# Patient Record
Sex: Female | Born: 1980 | Race: White | Hispanic: No | Marital: Married | Smoking: Former smoker
Health system: Southern US, Community
[De-identification: ages and names within clinical notes are randomized; demographics above are authoritative.]

## PROBLEM LIST (undated history)

## (undated) DIAGNOSIS — M069 Rheumatoid arthritis, unspecified: Secondary | ICD-10-CM

## (undated) DIAGNOSIS — R918 Other nonspecific abnormal finding of lung field: Secondary | ICD-10-CM

## (undated) HISTORY — DX: Rheumatoid arthritis, unspecified: M06.9

## (undated) HISTORY — DX: Other nonspecific abnormal finding of lung field: R91.8

---

## 2008-01-16 HISTORY — PX: WISDOM TOOTH EXTRACTION: SHX21

## 2014-10-26 ENCOUNTER — Ambulatory Visit (INDEPENDENT_AMBULATORY_CARE_PROVIDER_SITE_OTHER): Payer: BLUE CROSS/BLUE SHIELD | Admitting: Physician Assistant

## 2014-10-26 VITALS — BP 118/86 | HR 76 | Temp 98.4°F | Resp 16 | Ht 64.0 in | Wt 135.0 lb

## 2014-10-26 DIAGNOSIS — J029 Acute pharyngitis, unspecified: Secondary | ICD-10-CM | POA: Diagnosis not present

## 2014-10-26 LAB — POCT RAPID STREP A (OFFICE): RAPID STREP A SCREEN: NEGATIVE

## 2014-10-26 NOTE — Progress Notes (Signed)
   Amy Choi  MRN: [id number] DOB: 01-16-1981  Subjective:  Pt presents to clinic with sore throat.  She has had problems with strep in the past. She started with a sore throat yesterday and she wants to make sure it is not strep throat because she has plans with other children later this week.  She has some mild additional cold symptoms.    Home treatment- herbal remedies that seem to be helping as well as a netti pot  There are no active problems to display for this patient.   No current outpatient prescriptions on file prior to visit.   No current facility-administered medications on file prior to visit.    Not on File  Review of Systems  Constitutional: Negative for fever and chills.  HENT: Positive for congestion (intermittnet - now resolved), postnasal drip, rhinorrhea (clear) and sore throat.   Respiratory: Negative for cough.   Gastrointestinal: Negative.   Musculoskeletal: Negative for myalgias.  Allergic/Immunologic: Positive for environmental allergies.  Neurological: Positive for light-headedness. Negative for headaches.   Objective:  BP 118/86 mmHg  Pulse 76  Temp(Src) 98.4 F (36.9 C) (Oral)  Resp 16  Ht 5\' 4"  (1.626 m)  Wt 135 lb (61.236 kg)  BMI 23.16 kg/m2  SpO2 98%  LMP 10/17/2014  Physical Exam  Constitutional: She is oriented to person, place, and time and well-developed, well-nourished, and in no distress.  HENT:  Head: Normocephalic and atraumatic.  Right Ear: Hearing, tympanic membrane, external ear and ear canal normal.  Left Ear: Hearing, tympanic membrane, external ear and ear canal normal.  Nose: Nose normal.  Mouth/Throat: Uvula is midline, oropharynx is clear and moist and mucous membranes are normal. Uvula swelling present.  Gentican violet stain in her mouth  Eyes: Conjunctivae are normal.  Neck: Normal range of motion.  Cardiovascular: Normal rate, regular rhythm and normal heart sounds.   No murmur heard. Pulmonary/Chest: Effort  normal and breath sounds normal.  Neurological: She is alert and oriented to person, place, and time. Gait normal.  Skin: Skin is warm and dry.  Psychiatric: Mood, memory, affect and judgment normal.  Vitals reviewed.  Results for orders placed or performed in visit on 10/26/14  POCT rapid strep A  Result Value Ref Range   Rapid Strep A Screen Negative Negative    Assessment and Plan :  Sore throat - Plan: POCT rapid strep A, Culture, Group A Strep   Continue symptomatic treatment  12/26/14 PA-C  [facility] 10/26/2014 10:12 AM

## 2014-10-27 LAB — CULTURE, GROUP A STREP: ORGANISM ID, BACTERIA: NORMAL

## 2014-10-31 ENCOUNTER — Ambulatory Visit (INDEPENDENT_AMBULATORY_CARE_PROVIDER_SITE_OTHER): Payer: BLUE CROSS/BLUE SHIELD | Admitting: Urgent Care

## 2014-10-31 VITALS — BP 121/84 | HR 74 | Temp 98.1°F | Resp 16 | Ht 64.0 in | Wt 136.0 lb

## 2014-10-31 DIAGNOSIS — J029 Acute pharyngitis, unspecified: Secondary | ICD-10-CM

## 2014-10-31 MED ORDER — HYDROCODONE-HOMATROPINE 5-1.5 MG/5ML PO SYRP: 5.0000 mL | ORAL_SOLUTION | Freq: Every evening | ORAL | Status: DC | PRN

## 2014-10-31 MED ORDER — AMOXICILLIN 875 MG PO TABS: 875.0000 mg | ORAL_TABLET | Freq: Two times a day (BID) | ORAL | Status: DC

## 2014-10-31 NOTE — Patient Instructions (Signed)

## 2014-11-01 NOTE — Progress Notes (Signed)
    MRN: [id number] DOB: [birth year]  Subjective:   Amy Choi is a 34 y.o. female presenting for chief complaint of Sore Throat and Ear Pain  Reports 1 week history of worsening sore throat, difficulty swallowing, neck pain. She has tried symptomatic relief with otc remedies. Reports 1 sick contact with Strep throat. Denies fever, cough, sinus pain, sinus congestion, ear pain, ear drainage, tooth pain. Denies any other aggravating or relieving factors, no other questions or concerns.  Amy Choi has a current medication list which includes the following prescription(s): amoxicillin and hydrocodone-homatropine. Also has No Known Allergies.  Amy Choi  has no past medical history on file. Also  has no past surgical history on file.  Objective:   Vitals: BP 121/84 mmHg  Pulse 74  Temp(Src) 98.1 F (36.7 C) (Oral)  Resp 16  Ht 5\' 4"  (1.626 m)  Wt 136 lb (61.689 kg)  BMI 23.33 kg/m2  SpO2 99%  LMP 10/17/2014  Physical Exam  Constitutional: She is oriented to person, place, and time. She appears well-developed and well-nourished.  HENT:  TM's intact bilaterally, no effusions or erythema. Nasal turbinates pink and moist. No sinus tenderness. Postnasal drip present, soft palate with violaceous stain from otc liquid med. Oropharynx with slight erythema but no exudates or abscesses.   Eyes: Right eye exhibits no discharge. Left eye exhibits no discharge. No scleral icterus.  Neck: Normal range of motion. Neck supple.  Cardiovascular: Normal rate, regular rhythm and intact distal pulses.  Exam reveals no gallop and no friction rub.   No murmur heard. Pulmonary/Chest: No respiratory distress. She has no wheezes. She has no rales.  Musculoskeletal: She exhibits no edema.  Lymphadenopathy:    She has cervical adenopathy (bilateral, anterior, L>R).  Neurological: She is alert and oriented to person, place, and time.  Skin: Skin is warm and dry. No rash noted. No erythema. No pallor.    Assessment and Plan :   1. Acute pharyngitis, unspecified etiology 2. Sore throat - Will cover for infectious process, rtc if no improvement despite antibiotic course.  12/17/2014, PA-C [facility] [phone] 11/01/2014 10:09 AM

## 2015-03-14 ENCOUNTER — Telehealth: Payer: Self-pay

## 2015-03-14 ENCOUNTER — Ambulatory Visit (INDEPENDENT_AMBULATORY_CARE_PROVIDER_SITE_OTHER): Payer: BLUE CROSS/BLUE SHIELD | Admitting: Physician Assistant

## 2015-03-14 ENCOUNTER — Ambulatory Visit (INDEPENDENT_AMBULATORY_CARE_PROVIDER_SITE_OTHER): Payer: BLUE CROSS/BLUE SHIELD

## 2015-03-14 VITALS — BP 116/82 | HR 92 | Temp 98.5°F | Resp 18 | Ht 64.0 in | Wt 130.2 lb

## 2015-03-14 DIAGNOSIS — L739 Follicular disorder, unspecified: Secondary | ICD-10-CM | POA: Diagnosis not present

## 2015-03-14 DIAGNOSIS — R0781 Pleurodynia: Secondary | ICD-10-CM

## 2015-03-14 DIAGNOSIS — B379 Candidiasis, unspecified: Secondary | ICD-10-CM | POA: Diagnosis not present

## 2015-03-14 DIAGNOSIS — R918 Other nonspecific abnormal finding of lung field: Secondary | ICD-10-CM | POA: Diagnosis not present

## 2015-03-14 LAB — POCT CBC
Granulocyte percent: 80.3 %G — AB (ref 37–80)
HCT, POC: 36.1 % — AB (ref 37.7–47.9)
Hemoglobin: 12.7 g/dL (ref 12.2–16.2)
LYMPH, POC: 1.3 (ref 0.6–3.4)
MCH: 28.9 pg (ref 27–31.2)
MCHC: 35.1 g/dL (ref 31.8–35.4)
MCV: 82.2 fL (ref 80–97)
MID (cbc): 0.3 (ref 0–0.9)
MPV: 7.1 fL (ref 0–99.8)
PLATELET COUNT, POC: 311 10*3/uL (ref 142–424)
POC Granulocyte: 6.5 (ref 2–6.9)
POC LYMPH PERCENT: 16.1 %L (ref 10–50)
POC MID %: 3.6 % (ref 0–12)
RBC: 4.39 M/uL (ref 4.04–5.48)
RDW, POC: 12.8 %
WBC: 8.1 10*3/uL (ref 4.6–10.2)

## 2015-03-14 LAB — D-DIMER, QUANTITATIVE: D-Dimer, Quant: 0.34 ug/mL-FEU (ref 0.00–0.48)

## 2015-03-14 MED ORDER — MUPIROCIN 2 % EX OINT: 1.0000 "application " | TOPICAL_OINTMENT | Freq: Three times a day (TID) | CUTANEOUS | Status: DC

## 2015-03-14 MED ORDER — FLUCONAZOLE 150 MG PO TABS: 150.0000 mg | ORAL_TABLET | Freq: Once | ORAL | Status: DC

## 2015-03-14 NOTE — Patient Instructions (Addendum)
Because you received an x-ray today, you will receive an invoice from [facility]. Please contact [facility] at [phone] with questions or concerns regarding your invoice. Our billing staff will not be able to assist you with those questions.  [facility] [phone]  Take diflucan once. Repeat in 72 hours. May repeat this process in 1 week if symptoms not totally improved. Apply mupirocin ointment to buttock lesions three times a day for 1 week.

## 2015-03-14 NOTE — Progress Notes (Signed)
[facility] [address] [place] [address] [phone]  Date:  03/14/2015   Name:  Amy Choi   DOB:  1980-02-13   MRN:  [id number]  PCP:  No primary care provider on file.    Chief Complaint: Chest Pain and Labs Only   History of Present Illness:  This is a 35 y.o. female with PMH rheumatoid arthritis who is presenting with pleuritic chest pain x 1 week. Right and left sided. Gets sharp pains with inhalation at right anterior and inferior chest and left upper anterior chest. Some sob but states no change from baseline. Was having some pain with movement of trunk and arms but this resolved 2 days ago. No wheezing. No injury. Not a smoker. No fever or chills. She does endorse a mild cough after viral uri a couple weeks ago. Nonproductive. Has copper IUD. No exogenous estrogen. No personal or fam hx blood clot. The CP did occur after returning from [place]. Did a lot of walking in [place] but no increased upper body activity.  Pt is wanting to be tested for MTHFR gene. States her daughter has a history of leukemia and tested positive.   Pt also complaining of anal itching for 4 months. She states she generally has problems with candida infections and tries to treat on own with essential oils. She is worried she now has a staph infection.  Review of Systems:  Review of Systems See HPI  There are no active problems to display for this patient.   Prior to Admission medications   Not on File    No Known Allergies  History reviewed. No pertinent past surgical history.  Social History  Substance Use Topics  . Smoking status: Never Smoker   . Smokeless tobacco: None  . Alcohol Use: None    History reviewed. No pertinent family history.  Medication list has been reviewed and updated.  Physical Examination:  Physical Exam Physical Exam  Constitutional: He is oriented to person, place, and time. He appears well-developed and well-nourished. No  distress.  HENT:  Head: Normocephalic and atraumatic.  Right Ear: Hearing normal.  Left Ear: Hearing normal.  Nose: Nose normal.  Mouth/Throat: Uvula is midline, oropharynx is clear and moist and mucous membranes are normal.  Eyes: Conjunctivae and lids are normal. Right eye exhibits no discharge. Left eye exhibits no discharge. No scleral icterus.  Neck: Trachea normal. Carotid bruit is not present. No thyromegaly present.  Cardiovascular: Normal rate, regular rhythm, normal heart sounds and normal pulses.   No murmur heard. Pulmonary/Chest: Effort normal and breath sounds normal. No respiratory distress. He has no wheezes. He has no rhonchi. He has no rales. He exhibits no tenderness.  Abdominal: Soft. Normal appearance. There is no tenderness.  Musculoskeletal: Normal range of motion.  Normal ROM of shoulders and neck without any pain  Lymphadenopathy:       Head (right side): No submental, no submandibular and no tonsillar adenopathy present.       Head (left side): No submental, no submandibular and no tonsillar adenopathy present.    He has no cervical adenopathy.  Neurological: He is alert and oriented to person, place, and time.  Skin: Skin is warm, dry and intact.  No LE swelling or edema. No calf tenderness. Homans negative. Erythematous kissing lesions surrounding anus. Few papules with overlying scab distributed over buttocks.  Psychiatric: He has a normal mood and affect. His speech is normal and behavior is normal. Thought content  normal.   BP 116/82 mmHg  Pulse 92  Temp(Src) 98.5 F (36.9 C) (Oral)  Resp 18  Ht 5\' 4"  (1.626 m)  Wt 130 lb 3.2 oz (59.058 kg)  BMI 22.34 kg/m2  SpO2 98%  LMP 03/05/2015  Results for orders placed or performed in visit on 03/14/15  D-dimer, quantitative (not at [facility])  Result Value Ref Range   D-Dimer, Quant 0.34 0.00 - 0.48 ug/mL-FEU  POCT CBC  Result Value Ref Range   WBC 8.1 4.6 - 10.2 K/uL   Lymph, poc 1.3 0.6 - 3.4   POC  LYMPH PERCENT 16.1 10 - 50 %L   MID (cbc) 0.3 0 - 0.9   POC MID % 3.6 0 - 12 %M   POC Granulocyte 6.5 2 - 6.9   Granulocyte percent 80.3 (A) 37 - 80 %G   RBC 4.39 4.04 - 5.48 M/uL   Hemoglobin 12.7 12.2 - 16.2 g/dL   HCT, POC [facility] (A) 42.5 - 47.9 %   MCV 82.2 80 - 97 fL   MCH, POC 28.9 27 - 31.2 pg   MCHC 35.1 31.8 - 35.4 g/dL   RDW, POC 95.6 %   Platelet Count, POC 311 142 - 424 K/uL   MPV 7.1 0 - 99.8 fL   Dg Chest 2 View  03/14/2015  CLINICAL DATA:  Pleuritic chest pain for 1 week. EXAM: CHEST  2 VIEW COMPARISON:  None. FINDINGS: Normal heart size. Normal mediastinal contour. No pneumothorax. No pleural effusion. There are a few faint nodular ground-glass opacities in the mid to lower lungs bilaterally. No acute consolidative airspace disease. No pulmonary edema. IMPRESSION: Nonspecific faint nodular ground-glass opacities in the mid to lower lungs bilaterally. No consolidative airspace disease. This could represent an infectious or inflammatory pneumonitis / bronchopneumonia. Recommend follow-up PA and lateral post treatment chest radiographs in 6-8 weeks. If these findings persist on follow-up chest radiographs, consider further evaluation with chest CT. Electronically Signed   By: 03/16/2015 M.D.   On: 03/14/2015 12:27   Assessment and Plan:  1. Candida infection 2. Folliculitis  Take diflucan once and repeat in 72 hours. May repeat this process again in 1 week if symptoms not yet resolved. She will apply mupirocin to scattered folliculitis areas TID x 1 week. Return in 2 weeks if symptoms not improving. - fluconazole (DIFLUCAN) 150 MG tablet; Take 1 tablet (150 mg total) by mouth once. Repeat in 72 hours  Dispense: 4 tablet; Refill: 0 - mupirocin ointment (BACTROBAN) 2 %; Apply 1 application topically 3 (three) times daily.  Dispense: 30 g; Refill: 0  3. Pleuritic chest pain 4. Opacity of lung on imaging study Pleuritic CP that started after returning from trip to [place]. Vitals  normal and no LE swelling/pain. Low risk for PE, obtained d-dimer which was negative. CBC wnl. Radiologist read xray as having faint bilateral opacities -- will treat with zpak. Return in 6-8 weeks for repeat radiograph to ensure opacities have resolved. If not, will send for chest CT. - POCT CBC - DG Chest 2 View; Future - D-dimer, quantitative (not at [facility]) - azithromycin (ZITHROMAX) 250 MG tablet; Take 2 tabs PO x 1 dose, then 1 tab PO QD x 4 days  Dispense: 6 tablet; Refill: 0   [facility]. [place] Min[place], MHS [facility]  03/15/2015

## 2015-03-14 NOTE — Telephone Encounter (Signed)
Informed pt of normal D-Dimer result. Reported result to Deliah Boston, PA-C.

## 2015-03-15 MED ORDER — AZITHROMYCIN 250 MG PO TABS: ORAL_TABLET | ORAL | Status: AC

## 2015-03-25 ENCOUNTER — Telehealth: Payer: Self-pay

## 2015-03-25 NOTE — Telephone Encounter (Signed)
.   Opacity of lung on imaging study Pleuritic CP that started after returning from trip to [place]. Vitals normal and no LE swelling/pain. Low risk for PE, obtained d-dimer which was negative. CBC wnl. Radiologist read xray as having faint bilateral opacities -- will treat with zpak. Return in 6-8 weeks for repeat radiograph to ensure opacities have resolved. If not, will send for chest CT. - POCT CBC - DG Chest 2 View; Future - D-dimer, quantitative (not at [facility]) - azithromycin (ZITHROMAX) 250 MG tablet; Take 2 tabs PO x 1 dose, then 1 tab PO QD x 4 days Dispense: 6 tablet; Refill: 0

## 2015-03-25 NOTE — Telephone Encounter (Signed)
Spoke with pt, she states she is better but it is only a little bit of pain. I advised her the ABX is still working and to watch this over the weekend. I I advised her that the next step would be a CT scan. Pt understood. Advised to RTC if worse.

## 2015-03-25 NOTE — Telephone Encounter (Signed)
Patient has a lung infection was prescribed a zpak. Patient finished the zpak and states it still hurts to inhale. Please advise!

## 2015-04-01 ENCOUNTER — Ambulatory Visit (INDEPENDENT_AMBULATORY_CARE_PROVIDER_SITE_OTHER): Payer: BLUE CROSS/BLUE SHIELD | Admitting: Physician Assistant

## 2015-04-01 ENCOUNTER — Telehealth: Payer: Self-pay

## 2015-04-01 VITALS — BP 118/80 | HR 77 | Temp 98.3°F | Resp 16 | Ht 63.0 in | Wt 129.0 lb

## 2015-04-01 DIAGNOSIS — L739 Follicular disorder, unspecified: Secondary | ICD-10-CM | POA: Diagnosis not present

## 2015-04-01 DIAGNOSIS — B379 Candidiasis, unspecified: Secondary | ICD-10-CM

## 2015-04-01 DIAGNOSIS — R0781 Pleurodynia: Secondary | ICD-10-CM | POA: Diagnosis not present

## 2015-04-01 MED ORDER — FLUCONAZOLE 150 MG PO TABS: 150.0000 mg | ORAL_TABLET | Freq: Once | ORAL | Status: DC

## 2015-04-01 NOTE — Telephone Encounter (Signed)
LMVM for patient to get a repeat CXR in 5 weeks.  She can go to integrative medicine or RTC.  Please call back with any questions.

## 2015-04-01 NOTE — Telephone Encounter (Signed)
-----   Message from Dorna Leitz, [place] sent at 04/01/2015 12:10 PM EDT ----- I forgot to mention to patient that the radiologist recommended follow up CXR in 5 weeks to make sure what they saw resolved. She can have that done at integrative medicine or she can return here for that. Thanks!

## 2015-04-01 NOTE — Patient Instructions (Addendum)
Do another round of fluconazole. There are 2 extra tabs that you may use if needed. Take ibuprofen 400-600 mg three times a day for 5-7 days and see if helps with your back pain. Follow up with integrative medicine. Return as needed.    IF you received an x-ray today, you will receive an invoice from [facility]. Please contact [facility] at [phone] with questions or concerns regarding your invoice.   IF you received labwork today, you will receive an invoice from [employer]. Please contact [facility] at [phone] with questions or concerns regarding your invoice.   Our billing staff will not be able to assist you with questions regarding bills from these companies.  You will be contacted with the lab results as soon as they are available. The fastest way to get your results is to activate your My Chart account. Instructions are located on the last page of this paperwork. If you have not heard from [place] regarding the results in 2 weeks, please contact this office.

## 2015-04-01 NOTE — Progress Notes (Signed)
[facility] [address] [place] [address] [phone]  Date:  04/01/2015   Name:  Amy Choi   DOB:  [birth year]   MRN:  [id number]  PCP:  No primary care provider on file.    Chief Complaint: Follow-up   History of Present Illness:  This is a 35 y.o. female who is presenting for follow up. I saw patient for pleuritic CP and anal candida on 03/14/15.   CP had started after returning from a flight from [place]. D-dimer negative. CBC wnl. CXR with "faint nodular ground-glass opacities in mid-lower lungs bilaterally". Treated with zpak and radiologist recommended f/u radiograph in 8 weeks. Pt is here stating pleuritic CP much improved but still with mild right sided thoracic back pain with deep inspiration. She was wondering if she should have another round of abx. Has mild dry cough occ, feels like clearing throat. No fever, chills, sob, wheezing.  Pt was given 4 tabs diflucan. She states itching is 90% improved, but not yet completely resolved. Took last tab diflucan 6 days ago. Denies vaginal itching or discharge.  She has made appt with [facility] on 3/20.  Review of Systems:  Review of Systems See HPI  There are no active problems to display for this patient.   Prior to Admission medications   Not on File    No Known Allergies  History reviewed. No pertinent past surgical history.  Social History  Substance Use Topics  . Smoking status: Never Smoker   . Smokeless tobacco: Never Used  . Alcohol Use: 0.0 oz/week    0 Standard drinks or equivalent per week    History reviewed. No pertinent family history.  Medication list has been reviewed and updated.  Physical Examination:  Physical Exam  Constitutional: She is oriented to person, place, and time. She appears well-developed and well-nourished. No distress.  HENT:  Head: Normocephalic and atraumatic.  Right Ear: Hearing normal.  Left Ear: Hearing normal.  Nose: Nose  normal.  Eyes: Conjunctivae and lids are normal. Right eye exhibits no discharge. Left eye exhibits no discharge. No scleral icterus.  Cardiovascular: Normal rate, regular rhythm, normal heart sounds and normal pulses.   No murmur heard. Pulmonary/Chest: Effort normal and breath sounds normal. No respiratory distress. She has no wheezes. She has no rhonchi. She has no rales. She exhibits no tenderness.  No anterior chest tenderness or posterior thoracic tenderness  Musculoskeletal: Normal range of motion.  Lymphadenopathy:       Head (right side): No submental, no submandibular and no tonsillar adenopathy present.       Head (left side): No submental, no submandibular and no tonsillar adenopathy present.    She has no cervical adenopathy.  Neurological: She is alert and oriented to person, place, and time.  Skin: Skin is warm, dry and intact.  Light pink anal rash, improved from last visit. No vulvar rash. Improving folliculitis on bilateral buttocks.  Psychiatric: She has a normal mood and affect. Her speech is normal and behavior is normal. Thought content normal.   BP 118/80 mmHg  Pulse 77  Temp(Src) 98.3 F (36.8 C) (Oral)  Resp 16  Ht 5\' 3"  (1.6 m)  Wt 129 lb (58.514 kg)  BMI 22.86 kg/m2  SpO2 99%  LMP 03/26/2015 (Approximate)  Assessment and Plan:  1. Pleuritic chest pain CP overall much improved. Now just with mild right sided thoracic pain with deep inspiration. No concerning symptoms. Advised take ibuprofen 400-600 mg  TID for next 5-7 days and see if symptoms improve. Follow up with integrative medicine. Get repeat CXR there in 5 weeks.  2. Candida infection Symptoms much improved but not yet resolved. Gave another round of diflucan and 2 extra on hand to use if needed. - fluconazole (DIFLUCAN) 150 MG tablet; Take 1 tablet (150 mg total) by mouth once. Repeat in 72 hours  Dispense: 4 tablet; Refill: 0  3. Folliculitis Continue mupirocin. Improving.    [place], MHS [facility]  04/01/2015

## 2015-05-12 ENCOUNTER — Ambulatory Visit (INDEPENDENT_AMBULATORY_CARE_PROVIDER_SITE_OTHER): Payer: BLUE CROSS/BLUE SHIELD

## 2015-05-12 ENCOUNTER — Telehealth: Payer: Self-pay

## 2015-05-12 ENCOUNTER — Ambulatory Visit
Admission: RE | Admit: 2015-05-12 | Discharge: 2015-05-12 | Disposition: A | Discharge status: Home / Self Care | Payer: BLUE CROSS/BLUE SHIELD | Source: Ambulatory Visit | Attending: Physician Assistant | Admitting: Physician Assistant

## 2015-05-12 ENCOUNTER — Ambulatory Visit (INDEPENDENT_AMBULATORY_CARE_PROVIDER_SITE_OTHER): Payer: BLUE CROSS/BLUE SHIELD | Admitting: Physician Assistant

## 2015-05-12 ENCOUNTER — Other Ambulatory Visit: Payer: Self-pay | Admitting: Physician Assistant

## 2015-05-12 ENCOUNTER — Telehealth: Payer: Self-pay | Admitting: Family Medicine

## 2015-05-12 VITALS — BP 100/62 | HR 72 | Temp 98.0°F | Ht 63.5 in | Wt 128.0 lb

## 2015-05-12 DIAGNOSIS — R938 Abnormal findings on diagnostic imaging of other specified body structures: Secondary | ICD-10-CM

## 2015-05-12 DIAGNOSIS — R718 Other abnormality of red blood cells: Secondary | ICD-10-CM

## 2015-05-12 DIAGNOSIS — R9389 Abnormal findings on diagnostic imaging of other specified body structures: Secondary | ICD-10-CM

## 2015-05-12 DIAGNOSIS — L52 Erythema nodosum: Secondary | ICD-10-CM | POA: Diagnosis not present

## 2015-05-12 DIAGNOSIS — R918 Other nonspecific abnormal finding of lung field: Secondary | ICD-10-CM | POA: Insufficient documentation

## 2015-05-12 DIAGNOSIS — E639 Nutritional deficiency, unspecified: Secondary | ICD-10-CM

## 2015-05-12 HISTORY — DX: Other nonspecific abnormal finding of lung field: R91.8

## 2015-05-12 LAB — POCT URINALYSIS DIP (MANUAL ENTRY)
BILIRUBIN UA: NEGATIVE
BILIRUBIN UA: NEGATIVE
Glucose, UA: NEGATIVE
LEUKOCYTES UA: NEGATIVE
NITRITE UA: NEGATIVE
Protein Ur, POC: NEGATIVE
Spec Grav, UA: <=1.005
Urobilinogen, UA: 0.2
pH, UA: 6

## 2015-05-12 LAB — COMPLETE METABOLIC PANEL WITH GFR
ALK PHOS: 51 U/L (ref 33–115)
ALT: 6 U/L (ref 6–29)
AST: 8 U/L — AB (ref 10–30)
Albumin: 3.4 g/dL — ABNORMAL LOW (ref 3.6–5.1)
BILIRUBIN TOTAL: 0.3 mg/dL (ref 0.2–1.2)
BUN: 15 mg/dL (ref 7–25)
CALCIUM: 8.8 mg/dL (ref 8.6–10.2)
CHLORIDE: 102 mmol/L (ref 98–110)
CO2: 25 mmol/L (ref 20–31)
CREATININE: 0.69 mg/dL (ref 0.50–1.10)
GFR, Est Non African American: >89 mL/min (ref >=60)
Glucose, Bld: 82 mg/dL (ref 65–99)
Potassium: 4.3 mmol/L (ref 3.5–5.3)
Sodium: 135 mmol/L (ref 135–146)
TOTAL PROTEIN: 6.9 g/dL (ref 6.1–8.1)

## 2015-05-12 LAB — POCT CBC
Granulocyte percent: 77.2 %G (ref 37–80)
HEMATOCRIT: 29.4 % — AB (ref 37.7–47.9)
Hemoglobin: 10.4 g/dL — AB (ref 12.2–16.2)
LYMPH, POC: 1.3 (ref 0.6–3.4)
MCH, POC: 27.2 pg (ref 27–31.2)
MCHC: 35.3 g/dL (ref 31.8–35.4)
MCV: 77.2 fL — AB (ref 80–97)
MID (CBC): 0.6 (ref 0–0.9)
MPV: 6.9 fL (ref 0–99.8)
POC GRANULOCYTE: 6.5 (ref 2–6.9)
POC LYMPH %: 16 % (ref 10–50)
POC MID %: 6.8 % (ref 0–12)
Platelet Count, POC: 365 10*3/uL (ref 142–424)
RBC: 3.81 M/uL — AB (ref 4.04–5.48)
RDW, POC: 12.9 %
WBC: 8.4 10*3/uL (ref 4.6–10.2)

## 2015-05-12 LAB — C-REACTIVE PROTEIN: CRP: 5.7 mg/dL — AB (ref <0.60)

## 2015-05-12 LAB — IRON: IRON: 10 ug/dL — AB (ref 40–190)

## 2015-05-12 LAB — POCT SEDIMENTATION RATE: POCT SED RATE: 115 mm/h — AB (ref 0–22)

## 2015-05-12 MED ORDER — IOPAMIDOL (ISOVUE-300) INJECTION 61%
75.0000 mL | Freq: Once | INTRAVENOUS | Status: AC | PRN
  Administered 2015-05-12: 75 mL via INTRAVENOUS

## 2015-05-12 NOTE — Telephone Encounter (Signed)
Looks like Amy Choi spoke to pt. Please see her phone message

## 2015-05-12 NOTE — Telephone Encounter (Signed)
Patient returned call, notified and voiced understanding.  

## 2015-05-12 NOTE — Patient Instructions (Addendum)
You are to go over to [facility] now [address] [place].  Phone [phone].    IF you received an x-ray today, you will receive an invoice from [facility]. Please contact [facility] at [phone] with questions or concerns regarding your invoice.   IF you received labwork today, you will receive an invoice from [employer]. Please contact [facility] at [phone] with questions or concerns regarding your invoice.   Our billing staff will not be able to assist you with questions regarding bills from these companies.  You will be contacted with the lab results as soon as they are available. The fastest way to get your results is to activate your My Chart account. Instructions are located on the last page of this paperwork. If you have not heard from [place] regarding the results in 2 weeks, please contact this office.

## 2015-05-12 NOTE — Progress Notes (Signed)
05/12/2015 11:22 AM   DOB: 09-13-1980 / MRN: [id number]  SUBJECTIVE:  Amy Choi is a 35 y.o. female complaining of tender rash on her legs.  We started seeing her back in October 2016 for uri symptoms.  She was treated with antibiotics twice, first with amoxicillin for a worsening sore throat, and later with azithromycin for walking pneumonia due to abnormal chest rads on 03/15/2015. She is back today for a follow up chest xray per radiologist recs and began to develop a nodular painful rash on the bilateral shins and thighs.  Reports the lesions come up as colorless knots and later become red and pain, then resolve.   She has seen a integrative physician who advised that she had several vitamin deficiencies, including iron, vitamin D, vitamin B and advised that she stop eating so much spinach.  Patient was also advised that she does not have RA.   She has No Known Allergies.   She  has no past medical history on file.    She  reports that she quit smoking about 8 years ago. She has never used smokeless tobacco. She reports that she drinks alcohol. She reports that she does not use illicit drugs. She  has no sexual activity history on file. The patient  has no past surgical history on file.  Her family history is not on file.  Review of Systems  Respiratory: Positive for cough. Negative for shortness of breath.   Cardiovascular: Negative for chest pain.  Gastrointestinal: Negative for nausea and vomiting.  Skin: Positive for rash. Negative for itching.  Neurological: Negative for dizziness and headaches.    Problem list and medications reviewed and updated by myself where necessary, and exist elsewhere in the encounter.   OBJECTIVE:  BP 100/62 mmHg  Pulse 72  Temp(Src) 98 F (36.7 C) (Oral)  Ht 5' 3.5" (1.613 m)  Wt 128 lb (58.06 kg)  BMI 22.32 kg/m2  SpO2 99%  LMP 05/10/2015 (Exact Date)  Physical Exam  Constitutional: She is oriented to person, place, and time. She appears  well-nourished. No distress.  Eyes: EOM are normal. Pupils are equal, round, and reactive to light.  Cardiovascular: Normal rate.   Pulmonary/Chest: Effort normal.  Abdominal: She exhibits no distension.  Neurological: She is alert and oriented to person, place, and time. No cranial nerve deficit. Gait normal.  Skin: Skin is dry. She is not diaphoretic.  Psychiatric: She has a normal mood and affect.  Vitals reviewed.   Results for orders placed or performed in visit on 05/12/15 (from the past 72 hour(s))  POCT CBC     Status: Abnormal   Collection Time: 05/12/15 10:58 AM  Result Value Ref Range   WBC 8.4 4.6 - 10.2 K/uL   Lymph, poc 1.3 0.6 - 3.4   POC LYMPH PERCENT 16.0 10 - 50 %L   MID (cbc) 0.6 0 - 0.9   POC MID % 6.8 0 - 12 %M   POC Granulocyte 6.5 2 - 6.9   Granulocyte percent 77.2 37 - 80 %G   RBC 3.81 (A) 4.04 - 5.48 M/uL   Hemoglobin 10.4 (A) 12.2 - 16.2 g/dL   HCT, POC 62.7 (A) 03.5 - 47.9 %   MCV 77.2 (A) 80 - 97 fL   MCH, POC 27.2 27 - 31.2 pg   MCHC 35.3 31.8 - 35.4 g/dL   RDW, POC 00.9 %   Platelet Count, POC 365 142 - 424 K/uL   MPV 6.9 0 -  99.8 fL  POCT urinalysis dipstick     Status: Abnormal   Collection Time: 05/12/15 10:58 AM  Result Value Ref Range   Color, UA yellow yellow   Clarity, UA clear clear   Glucose, UA negative negative   Bilirubin, UA negative negative   Ketones, POC UA negative negative   Spec Grav, UA <=1.005    Blood, UA large (A) negative   pH, UA 6.0    Protein Ur, POC negative negative   Urobilinogen, UA 0.2    Nitrite, UA Negative Negative   Leukocytes, UA Negative Negative    Dg Chest 2 View  05/12/2015  CLINICAL DATA:  Cough. EXAM: CHEST  2 VIEW COMPARISON:  03/14/2015. FINDINGS: Mediastinum hilar structures are normal. Mild areas of atelectasis and/or infiltrates left lower lobe. Previously identified patchy infiltrates in the right lung are clear. No pleural effusion or pneumothorax. IMPRESSION: Mild areas of atelectasis  and/or infiltrate left lower lobe. Follow-up chest x-rays recommended to demonstrate complete clearing . Electronically Signed   By: Maisie Fus  Register   On: 05/12/2015 11:13    ASSESSMENT AND PLAN  Tyger was seen today for cough, sore throat, otalgia, toe pain and joint swelling.  Diagnoses and all orders for this visit:  Abnormal chest x-ray: She is medically complex.  She does have a history of rheumatoid arthritis and has not been medicated for this. She has had erythema nodosum now for about 2 weeks and reports this is worsening.  Her chest rads continue to come back abnormal.  Will get a CT scan today to learn more and rule out sarcoid and lung malignancy.  If normal will send to rheumatology to sort out spurious history of rheumatological disease and will rule out Celiac with TTG. .  -     DG Chest 2 View; Future  Erythema nodosum: Sarcoid vs lung malignancy vs GI inflammatory process.  ?Celiac? -     POCT CBC -     POCT urinalysis dipstick -     COMPLETE METABOLIC PANEL WITH GFR  Microcytosis: See problem 2.    The patient was advised to call or return to clinic if she does not see an improvement in symptoms or to seek the care of the closest emergency department if she worsens with the above plan.   Deliah Boston, MHS, PA-C [facility] 05/12/2015 11:22 AM

## 2015-05-12 NOTE — Telephone Encounter (Signed)
Patient called Amy Choi back wanting to ask her something about her results, she would like a call back at [phone]. See previous phone message from 05/12/15

## 2015-05-12 NOTE — Telephone Encounter (Signed)
Left message for patient to return call. CT chest was abnormal. It showed right hilar lymph node and multiple scattered nodules throughout the lungs bilaterally so she needs further eval from pulmonologist. She has been scheduled to see Dr Jamison Neighbor, [facility] [address] tomorrow, Friday 04/17/15 at 9:15 am.

## 2015-05-13 ENCOUNTER — Other Ambulatory Visit (INDEPENDENT_AMBULATORY_CARE_PROVIDER_SITE_OTHER): Payer: BLUE CROSS/BLUE SHIELD

## 2015-05-13 ENCOUNTER — Ambulatory Visit (INDEPENDENT_AMBULATORY_CARE_PROVIDER_SITE_OTHER): Payer: BLUE CROSS/BLUE SHIELD | Admitting: Pulmonary Disease

## 2015-05-13 ENCOUNTER — Telehealth: Payer: Self-pay | Admitting: Pulmonary Disease

## 2015-05-13 ENCOUNTER — Encounter: Payer: Self-pay | Admitting: Pulmonary Disease

## 2015-05-13 VITALS — BP 102/70 | HR 80 | Ht 63.0 in | Wt 128.2 lb

## 2015-05-13 DIAGNOSIS — R918 Other nonspecific abnormal finding of lung field: Secondary | ICD-10-CM

## 2015-05-13 DIAGNOSIS — R05 Cough: Secondary | ICD-10-CM

## 2015-05-13 DIAGNOSIS — M069 Rheumatoid arthritis, unspecified: Secondary | ICD-10-CM

## 2015-05-13 DIAGNOSIS — M059 Rheumatoid arthritis with rheumatoid factor, unspecified: Secondary | ICD-10-CM | POA: Diagnosis not present

## 2015-05-13 DIAGNOSIS — R059 Cough, unspecified: Secondary | ICD-10-CM | POA: Insufficient documentation

## 2015-05-13 LAB — C-REACTIVE PROTEIN: CRP: 5 mg/dL (ref 0.5–20.0)

## 2015-05-13 LAB — SEDIMENTATION RATE: SED RATE: 95 mm/h — AB (ref 0–22)

## 2015-05-13 NOTE — Progress Notes (Signed)
Subjective:    Patient ID: Amy Choi, female    DOB: 22-Sep-1980, 35 y.o.   MRN: [id number]  HPI She reports after traveling to [place] and returning on 02/28/15 she noticed a minor amount of pain with deep inspiration. At the time she had no cough. Reportedly she had an X-ray for evaluation for a "blood clot". Her imaging was abnormal so they treated her with a Z-pack. She continued to have chest discomfort and was evaluated for "chlamydia" and "mycoplasma" pneumonia with results that showed her "mycoplasma" antibodies were high. She was treated with another course of Arithomycin. Her pain had subsequently resolved but then she developed a cough that is only intermittently productive of a mucus. She does report chills but denies any fever. She reports starting yesterday she had some diaphoresis with her CT contrast yesterday but no drenching sweats. She reports during her visit in February she was treated with a  "vaginal and anal" rash that was presumed to be due to Candida and was treated with Diflucan. The rash did not completely clear up and she has been using a cream containing zinc. She has had painful red spots in her lower legs as well. She reports she was diagnosed with Rheumatoid Arthritis in 2014 and was started on Plaquenil. She developed an "allergic reaction" to Plaquenil and refused further treatments. She reports she had repeat serum testing after that showing very low levels. She reports she had severe joint stiffness and pain in her feet, right shoulder, and toes a week ago that seems to have resolved nearly completely. She still has some mild right shoulder pain that is residual. No morning stiffness in her hands. No dry eyes, dry mouth, or oral ulcers. She reports she has had sinus pressure and congestion. Denies any epistaxis.   Review of Systems She reports she had diarrhea for "3 weeks". She reports it was watery stool. No nausea or emesis. Did have abdominal discomfort with her  diarrhea. She denies any dysphagia or odynophagia. No dysuria or hematuria. A pertinent 14 point review of systems is negative except as per the history of presenting illness.  Allergies  Allergen Reactions  . Plaquenil [Hydroxychloroquine Sulfate] Diarrhea, Nausea Only and Rash    No current outpatient prescriptions on file prior to visit.   No current facility-administered medications on file prior to visit.    Past Medical History  Diagnosis Date  . Multiple lung nodules on CT 05/12/15    1 with cavitation  . Rheumatoid arthritis [facility])     Past Surgical History  Procedure Laterality Date  . Wisdom tooth extraction  2010    Family History  Problem Relation Age of Onset  . Alzheimer's disease Maternal Grandmother   . Cancer Paternal Grandfather   . Acute lymphoblastic leukemia Daughter     Social History   Social History  . Marital Status: Married    Spouse Name: N/A  . Number of Children: N/A  . Years of Education: N/A   Social History Main Topics  . Smoking status: Former Smoker -- 1.00 packs/day for 9 years    Types: Cigarettes    Quit date: 01/16/2004  . Smokeless tobacco: Never Used  . Alcohol Use: 0.0 oz/week    0 Standard drinks or equivalent per week     Comment: glass of wine occasionally  . Drug Use: No  . Sexual Activity: Not Asked   Other Topics Concern  . 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 She has lived in [place], [place] & moved to [place] in 2001. Previously has traveled to [place] & [place] in addition to [place]. Previously has worked in [employer] and also in [place]. Has 2 cats and a rabbit. Remote exposure to parakeets and cockatiels. In her last house there was a mold exposure.       Objective:   Physical Exam BP 102/70 mmHg  Pulse 80  Ht '5\' 3"'   (1.6 m)  Wt 128 lb 3.2 oz (58.151 kg)  BMI 22.72 kg/m2  SpO2 99%  LMP 05/10/2015 (Exact Date)  Exam performed with chaperone Maryann Conners present. General:  Awake. Alert. No acute distress. Thin, Caucasian female. Integument:  Warm & dry. Blanching macular lesions over bilateral lower extremities. No other appreciated rash  On exposed skin.Lymphatics:  No appreciated cervical or supraclavicular lymphadenoapthy. HEENT:  Moist mucus membranes. No oral ulcers. No scleral injection or icterus. Cardiovascular:  Regular rate. No edema. No appreciable JVD.  Pulmonary:  Good aeration & clear to auscultation bilaterally. Symmetric chest wall expansion. No accessory muscle use. Abdomen: Soft. Normal bowel sounds. Nondistended. Grossly nontender. Musculoskeletal:  Normal bulk and tone. Hand grip strength 5/5 bilaterally. No joint deformity or effusion appreciated. Neurological:  CN 2-12 grossly in tact. No meningismus. Moving all 4 extremities equally. Symmetric brachioradialis deep tendon reflexes. Psychiatric:  Mood and affect congruent. Speech normal rhythm, rate & tone.   IMAGING CT CHEST W/ 05/12/15 (personally reviewed by me): No pleural effusion or thickening. No pericardial effusion. No pathologic mediastinal adenopathy. Scattered nodules throughout both lungs that are primarily peripheral. Nodules are all subcentimeter except for 1.4 cm cavitary nodule within left lower lobe.  LABS 05/12/15 POCT ESR: 115 CRP: 5.7  03/14/15 D-Dimer: 0.34    Assessment & Plan:  35 year old Caucasian female previously diagnosed with rheumatoid arthritis. Patient does have a cavitary lung nodule as well as other solid lung nodules on CT imaging that I reviewed with her personally today. Malignancy is less likely given the pattern on CT imaging. The patient denies any IV drug use making an infectious etiology less likely as well. She has good dentition which also argues against Lemierre's syndrome. An autoimmune  etiology is the most probable cause given her previous diagnosis of rheumatoid arthritis despite repeat negative testing. The pattern of blanching macular areas on her lower extremities are not consistent with erythema nodosum. Given her previous residence in the [place] along the [place] an infectious etiology such as Histoplasma or Blastomyces much also be considered. We did discuss surgical lung biopsy versus bronchoscopy with lavage. I'm holding on further invasive testing and/or biopsy at this time pending results from her serum workup. I instructed the patient contact my office for any new problems or questions before her next appointment.  1. Bilateral lung nodules: Checking serum autoimmune workup. Checking full pulmonary function testing. Checking urine Histoplasma and Blastomyces antigens. Checking serum ANCA panel. Checking serum Procalcitonin and beta D glucan. 2. Rheumatoid arthritis: Holding on referral to local rheumatologist at this time. Checking serum ESR, CRP, ANA, SSA, SSB, Smith antibody, double-stranded DNA antibody, rheumatoid factor, & anti-CCP. Also holding on immunosuppression at this time. 3. Cough: Patient unable to produce a specimen. Likely secondary to her lung nodular disease. Consider bronchoscopy  versus surgical lung biopsy at next appointment after workup is complete. 4. Follow-up: Patient to return to clinic in 2-4 weeks or sooner if needed.  Sonia Baller Ashok Cordia, M.D. [facility] Pager:  [phone] After 3pm or if no response, call [phone] 11:00 AM 05/13/2015

## 2015-05-13 NOTE — Addendum Note (Signed)
Addended by: Sheran Luz on: 05/13/2015 11:55 AM   Modules accepted: Orders

## 2015-05-13 NOTE — Telephone Encounter (Signed)
IMAGING CT CHEST W/ 05/12/15 (personally reviewed by me): No pleural effusion or thickening. No pericardial effusion. No pathologic mediastinal adenopathy. Scattered nodules throughout both lungs that are primarily peripheral. Nodules are all subcentimeter except for 1.4 cm cavitary nodule within left lower lobe.  LABS 05/12/15 POCT ESR:  115 CRP:  5.7  03/14/15 D-Dimer:  0.34

## 2015-05-13 NOTE — Patient Instructions (Signed)
   We will review your blood work at your next appointment.  Please call me if you have any new questions or breathing problems  I will see you back in 2-4 weeks  TESTS ORDERED: 1. Full pulmonary function testing at follow-up appointment 2. Serum and urine lab work today

## 2015-05-14 LAB — RHEUMATOID FACTOR: Rhuematoid fact SerPl-aCnc: 17 IU/mL — ABNORMAL HIGH (ref <=14)

## 2015-05-15 LAB — PROCALCITONIN

## 2015-05-16 LAB — CYCLIC CITRUL PEPTIDE ANTIBODY, IGG

## 2015-05-16 LAB — ANCA SCREEN W REFLEX TITER: ANCA SCREEN: POSITIVE — AB

## 2015-05-16 LAB — ANTI-SMITH ANTIBODY: ENA SM Ab Ser-aCnc: <1

## 2015-05-16 LAB — RFLX P-ANCA TITER: P-ANCA: 1:40 {titer} — ABNORMAL HIGH

## 2015-05-16 LAB — SJOGREN'S SYNDROME ANTIBODS(SSA + SSB)
SSA (Ro) (ENA) Antibody, IgG: <1
SSB (La) (ENA) Antibody, IgG: <1

## 2015-05-16 LAB — ANTI-DNA ANTIBODY, DOUBLE-STRANDED: DS DNA AB: 1 [IU]/mL

## 2015-05-17 ENCOUNTER — Telehealth: Payer: Self-pay | Admitting: Pulmonary Disease

## 2015-05-17 NOTE — Telephone Encounter (Signed)
[facility] calling to get codes for the Misc tests that were ordered.  Cannot run the tests without the 6 digit test code:  ANA quantitative Beta-D glucan  Called the lab downstairs and they advised me that they do not know codes needed for these tests. Dr. Jamison Neighbor, please advise.

## 2015-05-18 LAB — OTHER LAB TEST

## 2015-05-18 LAB — MVISTA BLASTOMYCES QNT AG, URINE

## 2015-05-18 LAB — HISTOPLASMA ANTIGEN, URINE: HISTOPLASMA ANTIGEN URINE: 2.3 ng/mL — AB (ref <0.5)

## 2015-05-18 NOTE — Telephone Encounter (Signed)
Got test codes from [facility]: Beta-D (aka Fungitell) = [id number] ANA IFA = [id number]  Called [facility] and spoke with Vinnie Langton, test codes given.  Vinnie Langton did state that the Beta-D is supposed to be a frozen specimen, we were not aware of this at the time of the visit.  Vinnie Langton stated that she will hold the specimens in case JN would like something added to the room temp specimens.  Dr Jamison Neighbor, sorry but we were not aware that the Beta-D needed to be frozen.  Would you like pt to return for this test?  Thank you.

## 2015-05-18 NOTE — Telephone Encounter (Signed)
No clue what these "test codes" are. Beta-D Glucan is also known as Serum Fungitell. As far as the ANA quantitative that is simply an ANA titer usually through IFA.

## 2015-05-19 NOTE — Telephone Encounter (Signed)
Yes please. Let's have her come back to redraw the Beta-D Glucan. Thanks.

## 2015-05-19 NOTE — Telephone Encounter (Signed)
Patient returned call, CB [phone]

## 2015-05-19 NOTE — Telephone Encounter (Signed)
Patient states that she is out of town and will not be back for 2 weeks.  Patient states that she will drop by the lab and have it drawn in 2 weeks when she gets back to town. To Jess for [facility]

## 2015-05-19 NOTE — Telephone Encounter (Signed)
LMTCB

## 2015-05-20 LAB — ANTINUCLEAR ANTIBODIES, IFA: ANA Titer 1: NEGATIVE

## 2015-05-20 LAB — SPECIMEN STATUS REPORT

## 2015-05-23 ENCOUNTER — Telehealth: Payer: Self-pay | Admitting: Pulmonary Disease

## 2015-05-23 NOTE — Telephone Encounter (Signed)
Spoke with [name] at [employer]. States that the account number we gave to add an ANA for this is incorrect. The number that was given was for GI. I have given a verbal for [name] to change this over to our account. Nothing further was needed.

## 2015-05-27 NOTE — Telephone Encounter (Signed)
Pt not back in town until next week sometime Will keep in my box to watch out for labs Will also route to JN to make him aware

## 2015-06-01 ENCOUNTER — Telehealth: Payer: Self-pay | Admitting: Pulmonary Disease

## 2015-06-01 DIAGNOSIS — I011 Acute rheumatic endocarditis: Secondary | ICD-10-CM

## 2015-06-01 NOTE — Telephone Encounter (Signed)
Last ov with JN on 05/13/15 Patient Instructions        We will review your blood work at your next appointment.  Please call me if you have any new questions or breathing problems  I will see you back in 2-4 weeks  TESTS ORDERED: 1. Full pulmonary function testing at follow-up appointment 2. Serum and urine lab work today   Called spoke with pt. She states that she spoke with [facility] and they will accept a referral from [facility]. She request a message be sent to [facility] for a referral. I explained to her that I would send the message and return her call with his recs. She voiced understanding and had no further questions.   Dr. Jamison Neighbor please advise

## 2015-06-01 NOTE — Telephone Encounter (Signed)
Spoke with pt and she states that she would like to see rheum at this time. She is not sure where she would like to go. She also thinks her old rheum will likely refer her. Pt will call us if she needs referral from [place]. Nothing further needed.

## 2015-06-03 NOTE — Telephone Encounter (Signed)
Spoke with pt. She is aware that we will be making this referral for her. Order has been placed. Nothing further was needed.

## 2015-06-03 NOTE — Telephone Encounter (Signed)
With her prior history of Rheumatoid Arthritis I am fine with [place] placing a referral to [facility]. JN.

## 2015-06-06 ENCOUNTER — Encounter: Payer: Self-pay | Admitting: Gastroenterology

## 2015-06-20 ENCOUNTER — Ambulatory Visit (INDEPENDENT_AMBULATORY_CARE_PROVIDER_SITE_OTHER): Payer: BLUE CROSS/BLUE SHIELD | Admitting: Pulmonary Disease

## 2015-06-20 ENCOUNTER — Telehealth: Payer: Self-pay | Admitting: Pulmonary Disease

## 2015-06-20 ENCOUNTER — Ambulatory Visit (HOSPITAL_COMMUNITY)
Admission: RE | Admit: 2015-06-20 | Discharge: 2015-06-20 | Disposition: A | Discharge status: Home / Self Care | Payer: BLUE CROSS/BLUE SHIELD | Source: Ambulatory Visit | Attending: Pulmonary Disease | Admitting: Pulmonary Disease

## 2015-06-20 ENCOUNTER — Ambulatory Visit (INDEPENDENT_AMBULATORY_CARE_PROVIDER_SITE_OTHER)
Admission: RE | Admit: 2015-06-20 | Discharge: 2015-06-20 | Disposition: A | Discharge status: Home / Self Care | Payer: BLUE CROSS/BLUE SHIELD | Source: Ambulatory Visit | Attending: Pulmonary Disease | Admitting: Pulmonary Disease

## 2015-06-20 ENCOUNTER — Encounter: Payer: Self-pay | Admitting: Pulmonary Disease

## 2015-06-20 VITALS — BP 110/72 | HR 74 | Ht 63.0 in | Wt 128.0 lb

## 2015-06-20 DIAGNOSIS — R05 Cough: Secondary | ICD-10-CM | POA: Insufficient documentation

## 2015-06-20 DIAGNOSIS — R918 Other nonspecific abnormal finding of lung field: Secondary | ICD-10-CM

## 2015-06-20 DIAGNOSIS — R059 Cough, unspecified: Secondary | ICD-10-CM

## 2015-06-20 LAB — PULMONARY FUNCTION TEST
DL/VA % pred: 109 %
DL/VA: 5.1 ml/min/mmHg/L
DLCO UNC: 21.01 ml/min/mmHg
DLCO unc % pred: 91 %
FEF 25-75 PRE: 2.84 L/s
FEF 25-75 Post: 2.83 L/sec
FEF2575-%Change-Post: 0 %
FEF2575-%PRED-POST: 88 %
FEF2575-%Pred-Pre: 88 %
FEV1-%CHANGE-POST: -1 %
FEV1-%PRED-POST: 89 %
FEV1-%PRED-PRE: 90 %
FEV1-Post: 2.66 L
FEV1-Pre: 2.69 L
FEV1FVC-%Change-Post: 2 %
FEV1FVC-%Pred-Pre: 101 %
FEV6-%Change-Post: -4 %
FEV6-%PRED-POST: 86 %
FEV6-%Pred-Pre: 89 %
FEV6-POST: 3.07 L
FEV6-PRE: 3.2 L
FEV6FVC-%PRED-POST: 101 %
FEV6FVC-%PRED-PRE: 101 %
FVC-%Change-Post: -4 %
FVC-%PRED-POST: 84 %
FVC-%PRED-PRE: 88 %
FVC-POST: 3.07 L
FVC-PRE: 3.2 L
POST FEV6/FVC RATIO: 100 %
PRE FEV6/FVC RATIO: 100 %
Post FEV1/FVC ratio: 87 %
Pre FEV1/FVC ratio: 84 %
RV % PRED: 85 %
RV: 1.24 L
TLC % PRED: 91 %
TLC: 4.5 L

## 2015-06-20 MED ORDER — ALBUTEROL SULFATE (2.5 MG/3ML) 0.083% IN NEBU
2.5000 mg | INHALATION_SOLUTION | Freq: Once | RESPIRATORY_TRACT | Status: AC
  Administered 2015-06-20: 2.5 mg via RESPIRATORY_TRACT

## 2015-06-20 NOTE — Progress Notes (Signed)
Subjective:    Patient ID: Amy Choi, female    DOB: 06/16/1980, 35 y.o.   MRN: [id number]  C.C.:  Follow-up for Bilateral Lung Nodules & Cough.  HPI Bilateral Lung Nodules: Autoimmgune workup suggests rheumatoid but urine Histoplasma antigen is elevated at 2.3. Referral placed to [facility] for further evaluation. She reports her appointment is next month.  Cough: Likely secondary to nodular lung disease. Reports her cough is unchanged. She still is not able to expectorate.  Review of Systems No fever, chills, or sweats. No chest pain or pressure. She reports a new rash after she was exposed to poison ivy. She reports her joint pain has improved somewhat with taking clostrum by injesting it daily. She has had no joint erythema but has had some swelling predominantly in her ankles, feet, & knees.   Allergies  Allergen Reactions  . Plaquenil [Hydroxychloroquine Sulfate] Diarrhea, Nausea Only and Rash    No current outpatient prescriptions on file prior to visit.   No current facility-administered medications on file prior to visit.    Past Medical History  Diagnosis Date  . Multiple lung nodules on CT 05/12/15    1 with cavitation  . Rheumatoid arthritis [facility])     Past Surgical History  Procedure Laterality Date  . Wisdom tooth extraction  2010    Family History  Problem Relation Age of Onset  . Alzheimer's disease Maternal Grandmother   . Cancer Paternal Grandfather   . Acute lymphoblastic leukemia Daughter     Social History   Social History  . Marital Status: Married    Spouse Name: N/A  . Number of Children: N/A  . Years of Education: N/A   Social History Main Topics  . Smoking status: Former Smoker -- 1.00 packs/day for 9 years    Types: Cigarettes    Quit date: 01/16/2004  . Smokeless tobacco: Never Used  . Alcohol Use: 0.0 oz/week    0 Standard drinks or equivalent per week     Comment: glass of wine occasionally  . Drug Use: No  .  Sexual Activity: Not Asked   Other Topics Concern  . 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 She has lived in [place], [place] & moved to [place] in 2001. Previously has traveled to [place] & [place] in addition to [place]. Previously has worked in [employer] and also in [place]. Has 2 cats and a rabbit. Remote exposure to parakeets and cockatiels. In her last house there was a mold exposure.       Objective:   Physical Exam BP 110/72 mmHg  Pulse 74  Ht 5' 3" (1.6 m)  Wt 128 lb (58.06 kg)  BMI 22.68 kg/m2  SpO2 100%  General:  Awake. Alert. No distress. Thin, Caucasian female. Integument:  Warm & dry. No bruising visible on exposed skin. HEENT:  Moist mucus membranes. No oral ulcers. No scleral injection. Cardiovascular:  Regular rate. No edema. Normal S1 & S2.  Pulmonary:  Clear bilaterally to auscultation. Normal work of breathing on room air. Speaking in complete sentences. Abdomen: Soft. Normal bowel sounds. Nondistended.  Musculoskeletal:  Normal bulk and tone. No joint deformity or effusion appreciated.  PFT 06/20/15:  FVC 3.20 L (80%) FEV1 2.69  L (90%) FEV1/FVC 0.84 FEF 25-75 2.84 L (88%) no bronchodilator response TLC 4.50 L (91%) RV 85% ERV 52% DLCO uncorrected 91%  IMAGING CT CHEST W/ 05/12/15 (previously reviewed by me): No pleural effusion or thickening. No pericardial effusion. No pathologic mediastinal adenopathy. Scattered nodules throughout both lungs that are primarily peripheral. Nodules are all subcentimeter except for 1.4 cm cavitary nodule within left lower lobe.  LABS 05/13/15 CRP: 5.0 ESR: 95 Procalcitonin:  <0.1 Urine Histo Ag:  2.3 Urine Blasto Ag:  Negative  ANA:  Negative Anti-CCP:  >250 RF:  17 DS-DNA Ab:  1 Smith Ab:  <1.0 SSA:  <1.0 SSB:  <1.0 P-ANCA:   1:40  05/12/15 POCT ESR: 115 CRP: 5.7  03/14/15 D-Dimer: 0.34    Assessment & Plan:  35 year old Caucasian female with bilateral lung nodules at least one of which is cavitary. Despite the patient's mild elevation in her urine Histoplasma antigen I remain skeptical about an infectious etiology to the lung nodules on her CT imaging. I again reviewed her CT scan today. I believe these are more likely secondary to her underlying rheumatoid and suspect this is also the primary cause for her cough. We did discuss bronchoscopy with transbronchial biopsies. I believe the transbronchial biopsy would be of low yield given the distribution of her lung nodules. We also discussed surgical lung biopsy but patient would prefer to hold off until she has seen rheumatology to decide on her next course of investigation. I instructed the patient to contact my office if she developed any new breathing problems or infectious symptoms before her next appointment. I am holding off on initiating immunosuppression at this time pending evaluation by [facility].  1. Bilateral lung nodules: Likely secondary to underlying rheumatoid arthritis. Repeat chest x-ray PA/LAT today. Holding on bronchoscopy/surgical lung biopsy at this time. 2. Cough: Likely secondary to underlying lung nodules/rheumatoid involvement of the lungs. Holding on bronchoscopy at this time. 3. Follow-up: Return to clinic in 6-8 weeks or sooner if needed.  Sonia Baller Ashok Cordia, M.D. [facility] Pulmonary & Critical Care Pager:  [phone] After 3pm or if no response, call [phone] 12:47 PM 06/20/2015

## 2015-06-20 NOTE — Telephone Encounter (Signed)
LABS 04/04/15 Mycoplasma pneumoniae IgG: 4.12 (</= 0.90) Chlamydia pneumoniae IgG:  <1:16 RF:  16 (</= 14)

## 2015-06-20 NOTE — Patient Instructions (Addendum)
   Call me if you develop any new problems breathing.  Let me know if you begin to cough up large volumes of mucus or if you have any fever, chills, or sweats.  I will see you back after your visit with Rheumatology.  TEST ORDERED: 1. CXR PA/LAT TODAY

## 2015-06-20 NOTE — Telephone Encounter (Signed)
No new note. 

## 2015-08-04 ENCOUNTER — Encounter: Payer: Self-pay | Admitting: Gastroenterology

## 2015-08-04 ENCOUNTER — Ambulatory Visit (INDEPENDENT_AMBULATORY_CARE_PROVIDER_SITE_OTHER): Payer: BLUE CROSS/BLUE SHIELD | Admitting: Gastroenterology

## 2015-08-04 VITALS — BP 100/60 | HR 60 | Ht 63.0 in | Wt 128.4 lb

## 2015-08-04 DIAGNOSIS — R197 Diarrhea, unspecified: Secondary | ICD-10-CM

## 2015-08-04 NOTE — Progress Notes (Signed)
[facility] Consult Note:  History: Amy Choi 08/04/2015  Referring physician: No PCP Per Patient  Reason for consult/chief complaint: Diarrhea   Subjective HPI:  This woman was referred for chronic diarrhea and abdominal pain. She reports becoming ill with a respiratory infection in late 2016, and it seems the diagnosis is still unclear. I see multiple visits with pulmonary and rheumatology. She had lung nodules and there was the possibility of needing an open lung biopsy. She had a positive histoplasmosis urine antigen, but that was not clearly felt to be the cause. She was told she probably has rheumatoid arthritis and that the lung nodules might be from that. He definitely got antibiotics sometime in the spring prior to the onset of diarrhea in April. She had the subacute onset of lower abdominal cramps and multiple loose nonbloody bowel movements per day. And on until just a few weeks ago, when seems to have largely resolved.Amanie is not sure if it was because she went on a gluten and dairy free diet or she started taking goat colostrum that she bought over the Internet. She now has occasional lower abdominal cramps but the just of symptoms have always completely resolved at this point. She still has a chronic cough that has been slowly improving, she does not know the next step in that workup. ROS:  Review of Systems  Constitutional: Positive for fever. Negative for appetite change and unexpected weight change.  HENT: Negative for mouth sores and voice change.   Eyes: Negative for pain and redness.  Respiratory: Positive for cough. Negative for shortness of breath.   Cardiovascular: Negative for chest pain and palpitations.  Genitourinary: Negative for dysuria and hematuria.  Musculoskeletal: Negative for myalgias and arthralgias.  Skin: Negative for pallor and rash.  Neurological: Negative for weakness and headaches.  Hematological: Negative for adenopathy.   She  believes she has lost about 5 pounds in the last several months.  Past Medical History: Past Medical History  Diagnosis Date  . Multiple lung nodules on CT 05/12/15    1 with cavitation  . Rheumatoid arthritis [facility])      Past Surgical History: Past Surgical History  Procedure Laterality Date  . Wisdom tooth extraction  2010     Family History: Family History  Problem Relation Age of Onset  . Alzheimer's disease Maternal Grandmother   . Cancer Paternal Grandfather   . Acute lymphoblastic leukemia Daughter     Social History: Social History   Social History  . Marital Status: Married    Spouse Name: N/A  . Number of Children: N/A  . Years of Education: N/A   Social History Main Topics  . Smoking status: Former Smoker -- 1.00 packs/day for 9 years    Types: Cigarettes    Quit date: 01/16/2004  . Smokeless tobacco: Never Used  . Alcohol Use: 0.0 oz/week    0 Standard drinks or equivalent per week     Comment: glass of wine occasionally  . Drug Use: No  . Sexual Activity: Not Asked   Other Topics Concern  . 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 She has lived in [place], [place] & moved to [place] in 2001. Previously has traveled to [place] & [place] in  addition to [place]. Previously has worked in [employer] and also in [employer]. Has 2 cats and a rabbit. Remote exposure to parakeets and cockatiels. In her last house there was a mold exposure.     Allergies: Allergies  Allergen Reactions  . Plaquenil [Hydroxychloroquine Sulfate] Diarrhea, Nausea Only and Rash    Outpatient Meds: Current Outpatient Prescriptions  Medication Sig Dispense Refill  . cholestyramine (QUESTRAN) 4 g packet Take 4 g by mouth 3 (three) times daily with meals.    . Liver 500 MG CAPS Take by mouth daily.     . Magnesium 250 MG TABS Take by mouth daily.     No current facility-administered medications for this visit.      ___________________________________________________________________ Objective  Exam:  BP 100/60 mmHg  Pulse 60  Ht '5\' 3"'  (1.6 m)  Wt 128 lb 6 oz (58.231 kg)  BMI 22.75 kg/m2   General: this is a(n) Well-appearing young woman, not acutely ill, no muscle wasting   Eyes: sclera anicteric, no redness  ENT: oral mucosa moist without lesions, no cervical or supraclavicular lymphadenopathy, good dentition  CV: RRR without murmur, S1/S2, no JVD, no peripheral edema  Resp: clear to auscultation bilaterally, normal RR and effort noted  GI: soft, no tenderness, with active bowel sounds. No guarding or palpable organomegaly noted.  Skin; warm and dry, no rash or jaundice noted  Neuro: awake, alert and oriented x 3. Normal gross motor function and fluent speech  Labs:  ESR=  95 in April See CT scan chest Extensive rheumatologic antibodies as well WBC 8.4, Hgb 10.4  in April 2017  Assessment: Encounter Diagnosis  Name Primary?  . Diarrhea, unspecified type Yes    It is not clear how or if her digestive symptoms are related to this other mysterious systemic illness. Nevertheless, they are resolved and she does not appear to need any further GI testing.  Plan:  See me as needed  Thank you for the courtesy of this consult.  Please call me with any questions or concerns.  Nelida Meuse III  CC: No PCP Per Patient

## 2015-08-04 NOTE — Patient Instructions (Signed)
If you are age 36 or older, your body mass index should be between 23-30. Your Body mass index is 22.75 kg/(m^2). If this is out of the aforementioned range listed, please consider follow up with your Primary Care Provider.  If you are age 26 or younger, your body mass index should be between 19-25. Your Body mass index is 22.75 kg/(m^2). If this is out of the aformentioned range listed, please consider follow up with your Primary Care Provider.   Thank you for choosing [facility]  Dr Amada Jupiter III

## 2015-08-09 ENCOUNTER — Ambulatory Visit (INDEPENDENT_AMBULATORY_CARE_PROVIDER_SITE_OTHER): Payer: BLUE CROSS/BLUE SHIELD | Admitting: Internal Medicine

## 2015-08-09 ENCOUNTER — Ambulatory Visit: Payer: BLUE CROSS/BLUE SHIELD | Admitting: Pulmonary Disease

## 2015-08-09 ENCOUNTER — Encounter: Payer: Self-pay | Admitting: Internal Medicine

## 2015-08-09 VITALS — BP 121/81 | HR 81 | Temp 98.1°F | Ht 63.0 in | Wt 121.9 lb

## 2015-08-09 DIAGNOSIS — R918 Other nonspecific abnormal finding of lung field: Secondary | ICD-10-CM | POA: Diagnosis not present

## 2015-08-09 NOTE — Progress Notes (Signed)
RFV: pulmonary nodule, rule out histo Subjective:    Patient ID: Amy Choi, female    DOB: 1980/06/20, 35 y.o.   MRN: [id number]  HPI 35yo F with Hx of RA dx in 2014.She reports being ill in dec, 3 course of abtx in early jan, early feb, and late feb. In early jan started to have pleuretic chest pain. Chest ct positive for nodular lung disease/ jse was seem by Dr Jamison Neighbor from [facility] who felt tat imaging and clinical history suggestive of RA pulmonary disease but was concern + histoplasma antigen test  The patient denies any history of bird exposure, bird keeping.  Work up showed mycoplasma ab, ur histo +  Allergies  Allergen Reactions  . Plaquenil [Hydroxychloroquine Sulfate] Diarrhea, Nausea Only and Rash   Current Outpatient Prescriptions on File Prior to Visit  Medication Sig Dispense Refill  . Liver 500 MG CAPS Take by mouth daily.    . Magnesium 250 MG TABS Take by mouth daily.    . cholestyramine (QUESTRAN) 4 g packet Take 4 g by mouth 3 (three) times daily with meals.     No current facility-administered medications on file prior to visit.    Active Ambulatory Problems    Diagnosis Date Noted  . Multiple lung nodules on CT 05/12/2015  . Rheumatoid arthritis (HCC) 05/13/2015  . Cough 05/13/2015   Resolved Ambulatory Problems    Diagnosis Date Noted  . Rheumatoid arthritis (HCC) 05/13/2015   Past Medical History:  Diagnosis Date  . Multiple lung nodules on CT 05/12/15  . Rheumatoid arthritis [facility])    Social History  Substance Use Topics  . Smoking status: Former Smoker    Packs/day: 1.00    Years: 9.00    Types: Cigarettes    Quit date: 01/16/2004  . Smokeless tobacco: Never Used  . Alcohol use 0.0 oz/week     Comment: glass of wine occasionally  family history includes Acute lymphoblastic leukemia in her daughter; Alzheimer's disease in her maternal grandmother; Cancer in her paternal grandfather. Review of Systems  Constitutional: Negative for  fever, chills, diaphoresis, activity change, appetite change, fatigue and unexpected weight change.  HENT: Negative for congestion, sore throat, rhinorrhea, sneezing, trouble swallowing and sinus pressure.  Eyes: Negative for photophobia and visual disturbance.  Respiratory: Negative for cough, chest tightness, shortness of breath, wheezing and stridor.  Cardiovascular: Negative for chest pain, palpitations and leg swelling.  Gastrointestinal: Negative for nausea, vomiting, abdominal pain, diarrhea, constipation, blood in stool, abdominal distention and anal bleeding.  Genitourinary: Negative for dysuria, hematuria, flank pain and difficulty urinating.  Musculoskeletal: Negative for myalgias, back pain, joint swelling, arthralgias and gait problem.  Skin: Negative for color change, pallor, rash and wound.  Neurological: Negative for dizziness, tremors, weakness and light-headedness.  Hematological: Negative for adenopathy. Does not bruise/bleed easily.  Psychiatric/Behavioral: Negative for behavioral problems, confusion, sleep disturbance, dysphoric mood, decreased concentration and agitation.       Objective:   Physical Exam BP 121/81 (BP Location: Left Arm, Patient Position: Sitting, Cuff Size: Small)   Pulse 81   Temp 98.1 F (36.7 C) (Oral)   Ht 5\' 3"  (1.6 m)   Wt 121 lb 14.4 oz (55.3 kg)   LMP 07/19/2015 (Approximate)   BMI 21.59 kg/m  Physical Exam  Constitutional:  oriented to person, place, and time. appears well-developed and well-nourished. No distress.  HENT: Xenia/AT, PERRLA, no scleral icterus Mouth/Throat: Oropharynx is clear and moist. No oropharyngeal exudate.  Cardiovascular: Normal rate, regular rhythm  and normal heart sounds. Exam reveals no gallop and no friction rub.  No murmur heard.  Pulmonary/Chest: Effort normal and breath sounds normal. No respiratory distress.  has no wheezes.  Neck = supple, no nuchal rigidity Abdominal: Soft. Bowel sounds are normal.   exhibits no distension. There is no tenderness.  Lymphadenopathy: no cervical adenopathy. No axillary adenopathy Neurological: alert and oriented to person, place, and time.  Skin: Skin is warm and dry. No rash noted. No erythema.  Psychiatric: a normal mood and affect.  behavior is normal.         Assessment & Plan:  - will check ur histo blood work. If furhter positive testing, will consider treatment. For now appears to have few symptoms  Addendum: called patient to let her know that her results were negative. She states that she is back to her baseline health.

## 2015-08-14 LAB — HISTOPLASMA ANTIBODIES: Histoplasma Ab, Immunodiffusion: NEGATIVE

## 2015-10-06 ENCOUNTER — Telehealth: Payer: Self-pay

## 2015-10-06 NOTE — Telephone Encounter (Signed)
Patient has not heard from our office and would like to know the results of labs performed at last visit. She was told on lab would take a while but wanted to make sure no one forgot.   Are there any pending test and if not has she been informed of the completed test?   Laurell Josephs, RN

## 2016-04-10 ENCOUNTER — Telehealth: Payer: Self-pay | Admitting: Pulmonary Disease

## 2016-04-11 NOTE — Telephone Encounter (Signed)
A fax was sent to [facility] at [phone] for an order for antinuclear antibodies. Nothing further is needed.

## 2016-11-23 IMAGING — CR DG CHEST 2V
2 series · 2 of 2 positions shown · non-contrast
Comparison: 03/14/2015.

CLINICAL DATA: Cough.

EXAM:
CHEST  2 VIEW

[lateral]
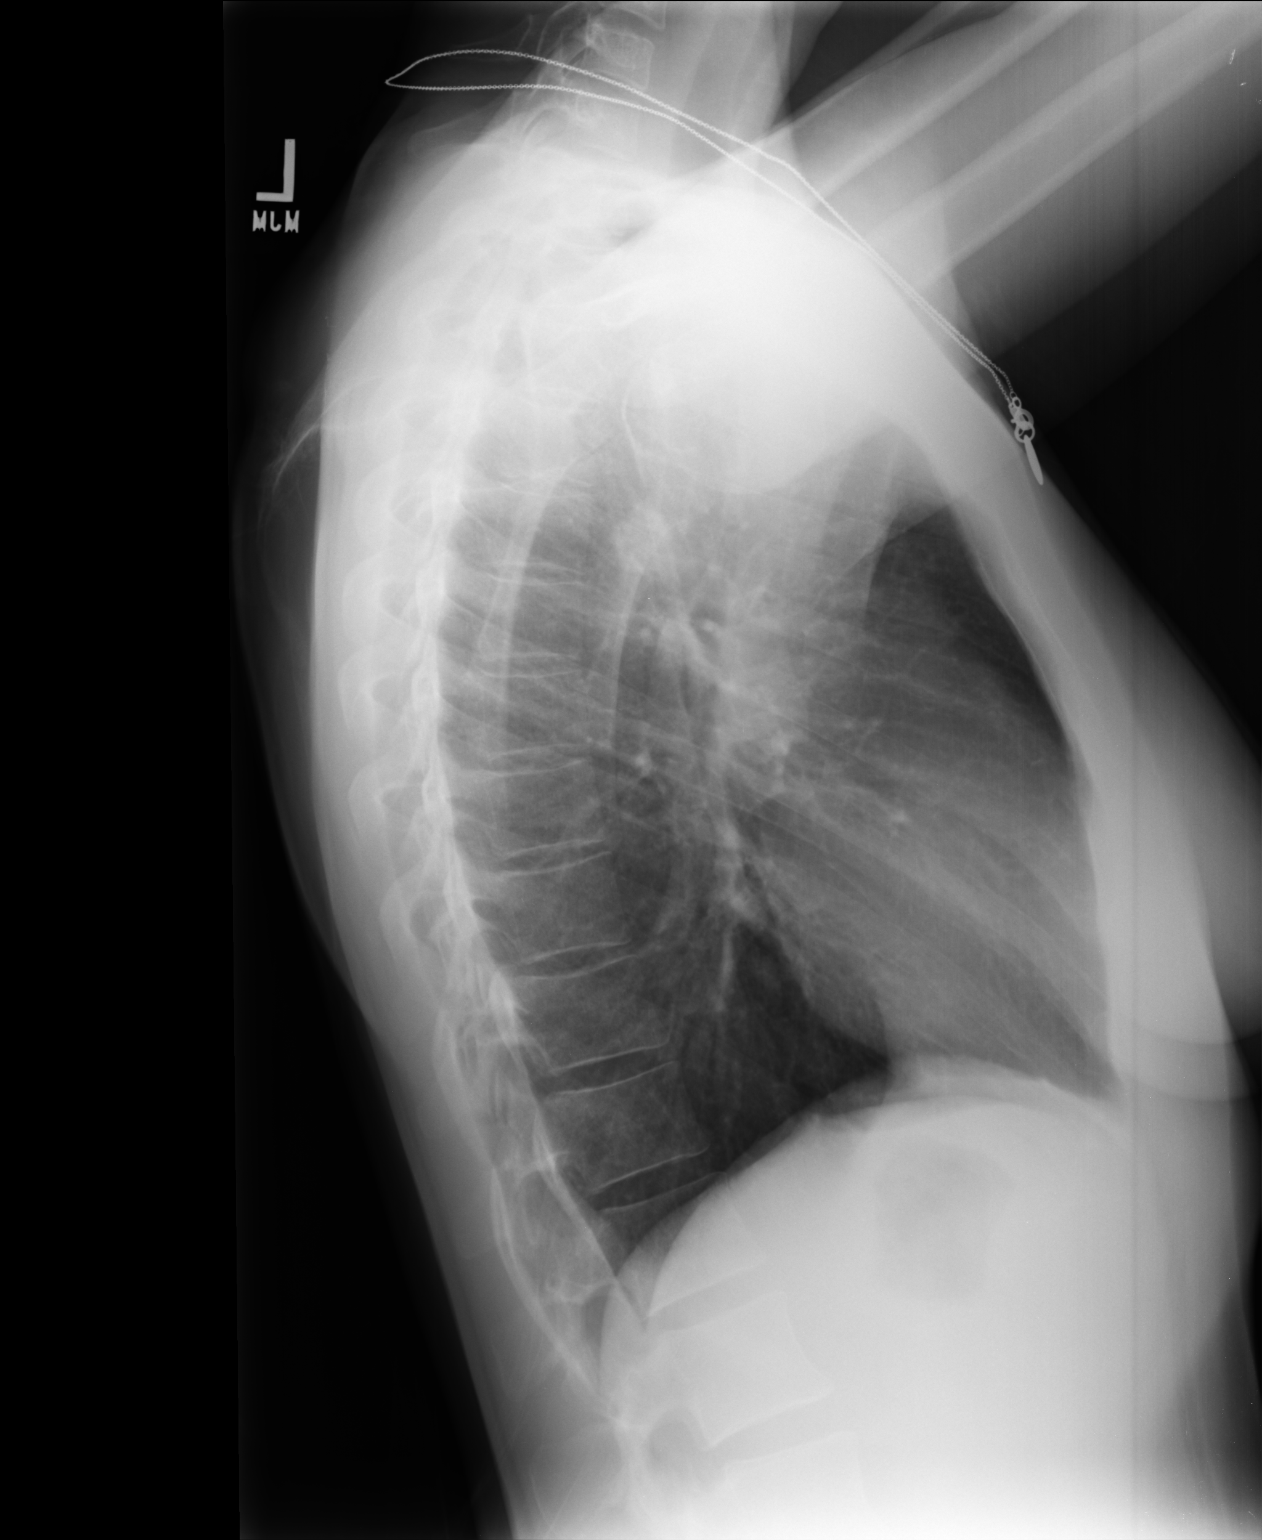

[PA]
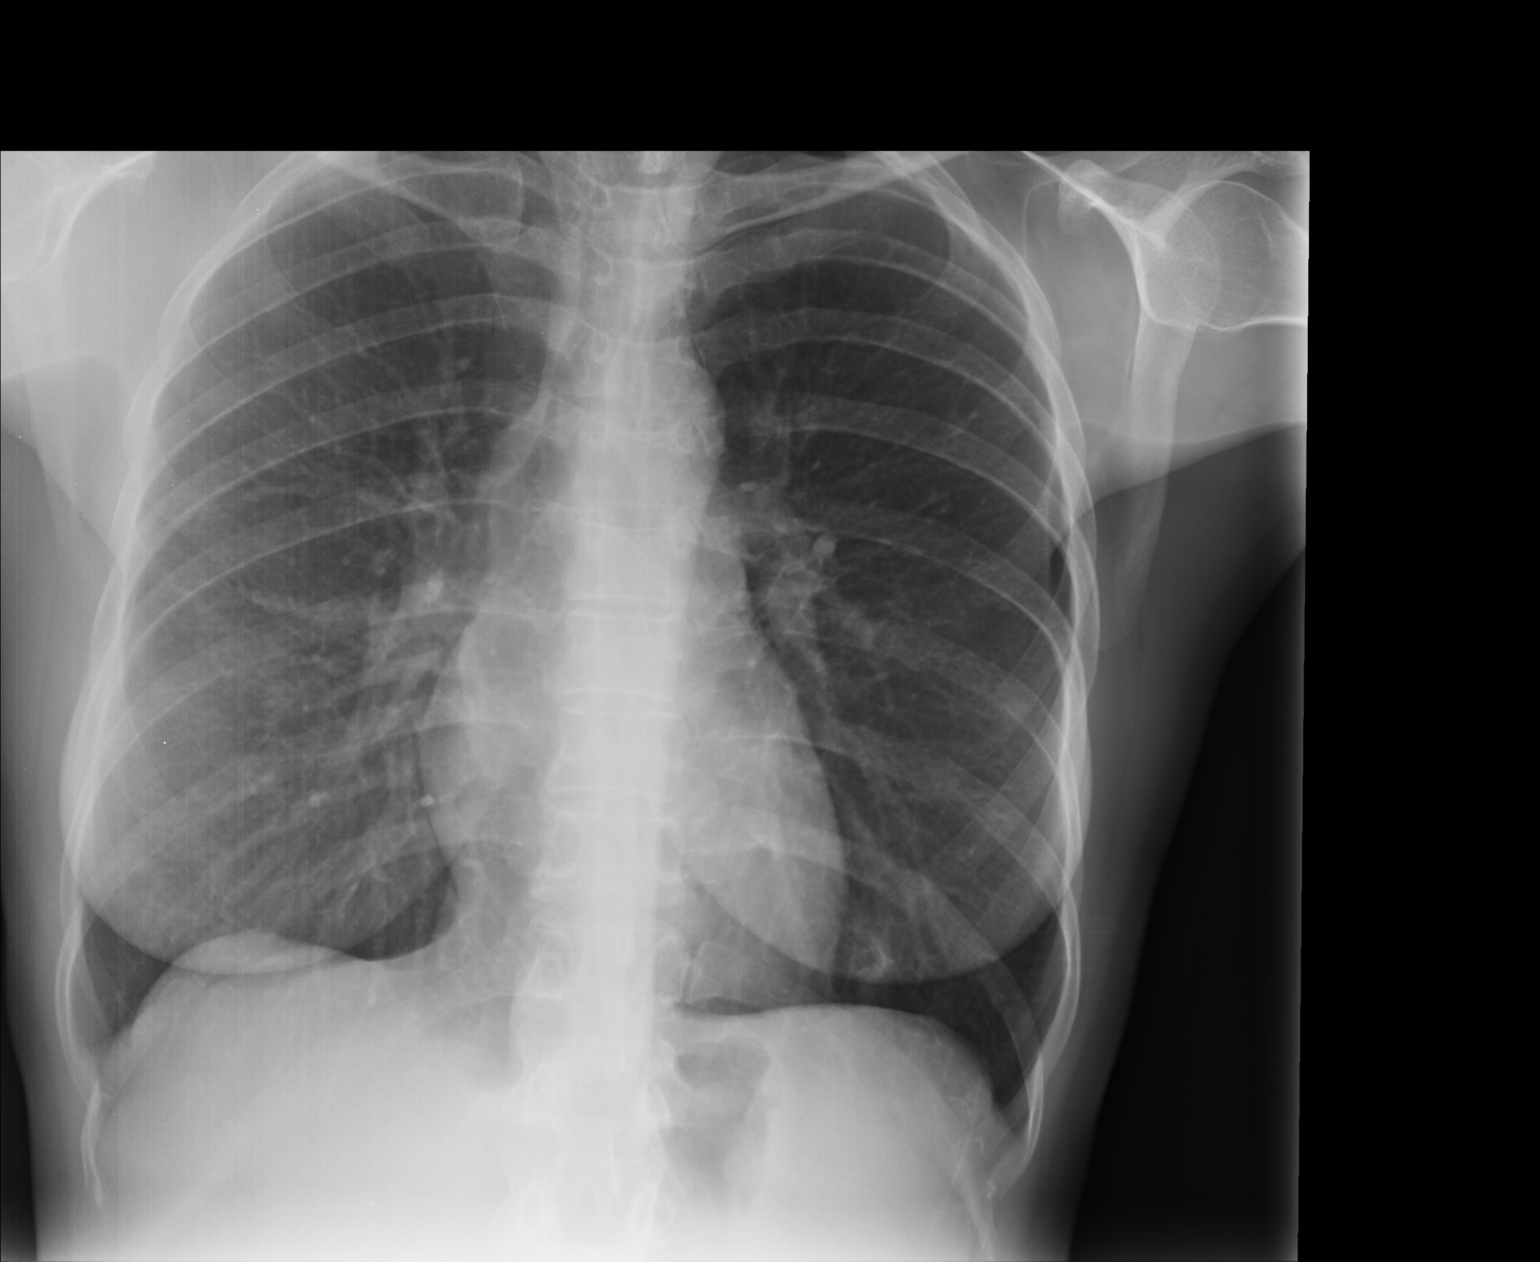

[2 of 2 positions shown; findings below may reference images not displayed]

FINDINGS: Mediastinum hilar structures are normal. Mild areas of atelectasis
and/or infiltrates left lower lobe. Previously identified patchy
infiltrates in the right lung are clear. No pleural effusion or
pneumothorax.
IMPRESSION: Mild areas of atelectasis and/or infiltrate left lower lobe.
Follow-up chest x-rays recommended to demonstrate complete clearing
.

## 2017-01-01 IMAGING — DX DG CHEST 2V
2 series · 2 of 2 positions shown · non-contrast
Comparison: 05/12/2015 radiographic and CT exams

CLINICAL DATA: Cough for 3 months, followup lung nodules on prior
CT, history rheumatoid arthritis, former smoker

EXAM:
CHEST  2 VIEW

[chest pa]
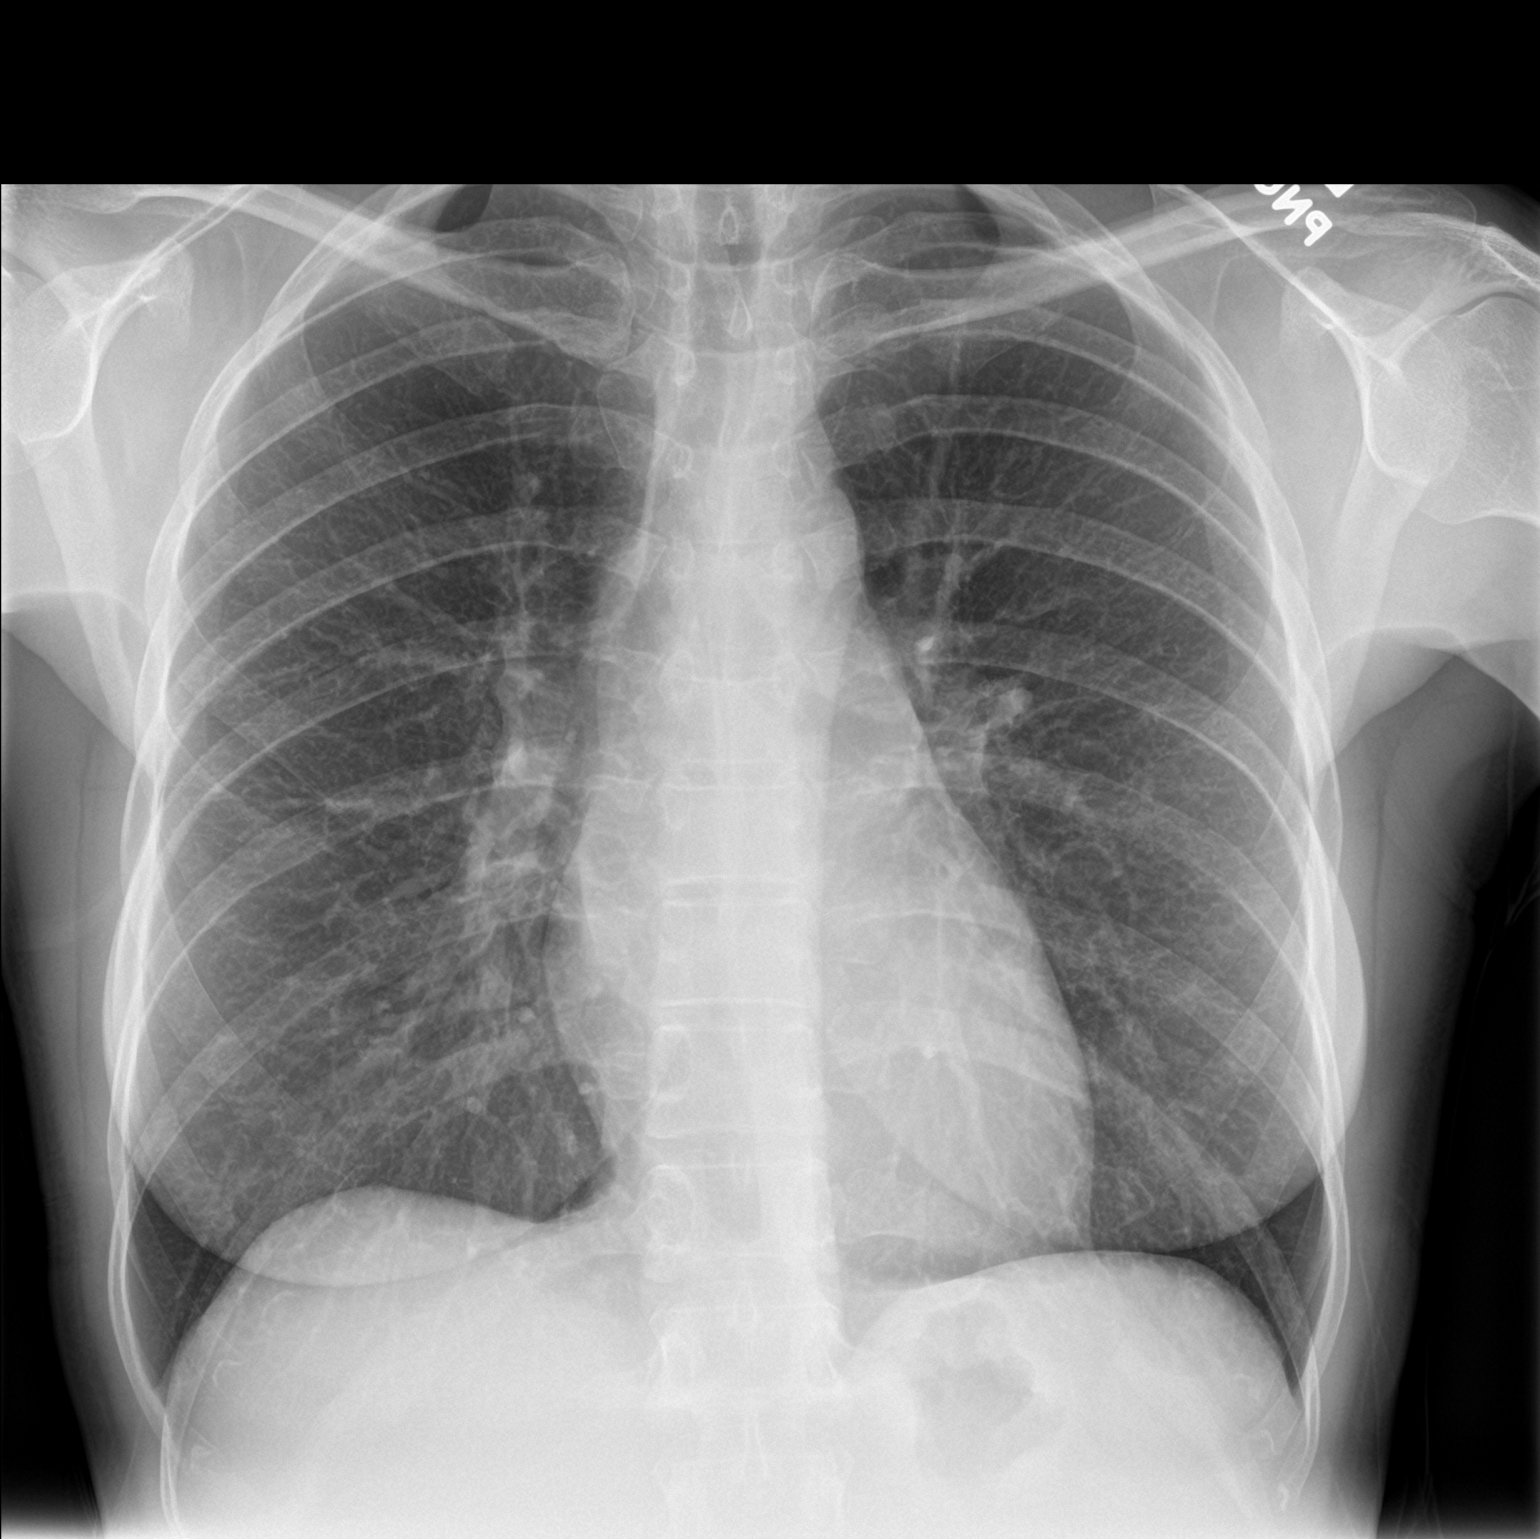

[chest lat]
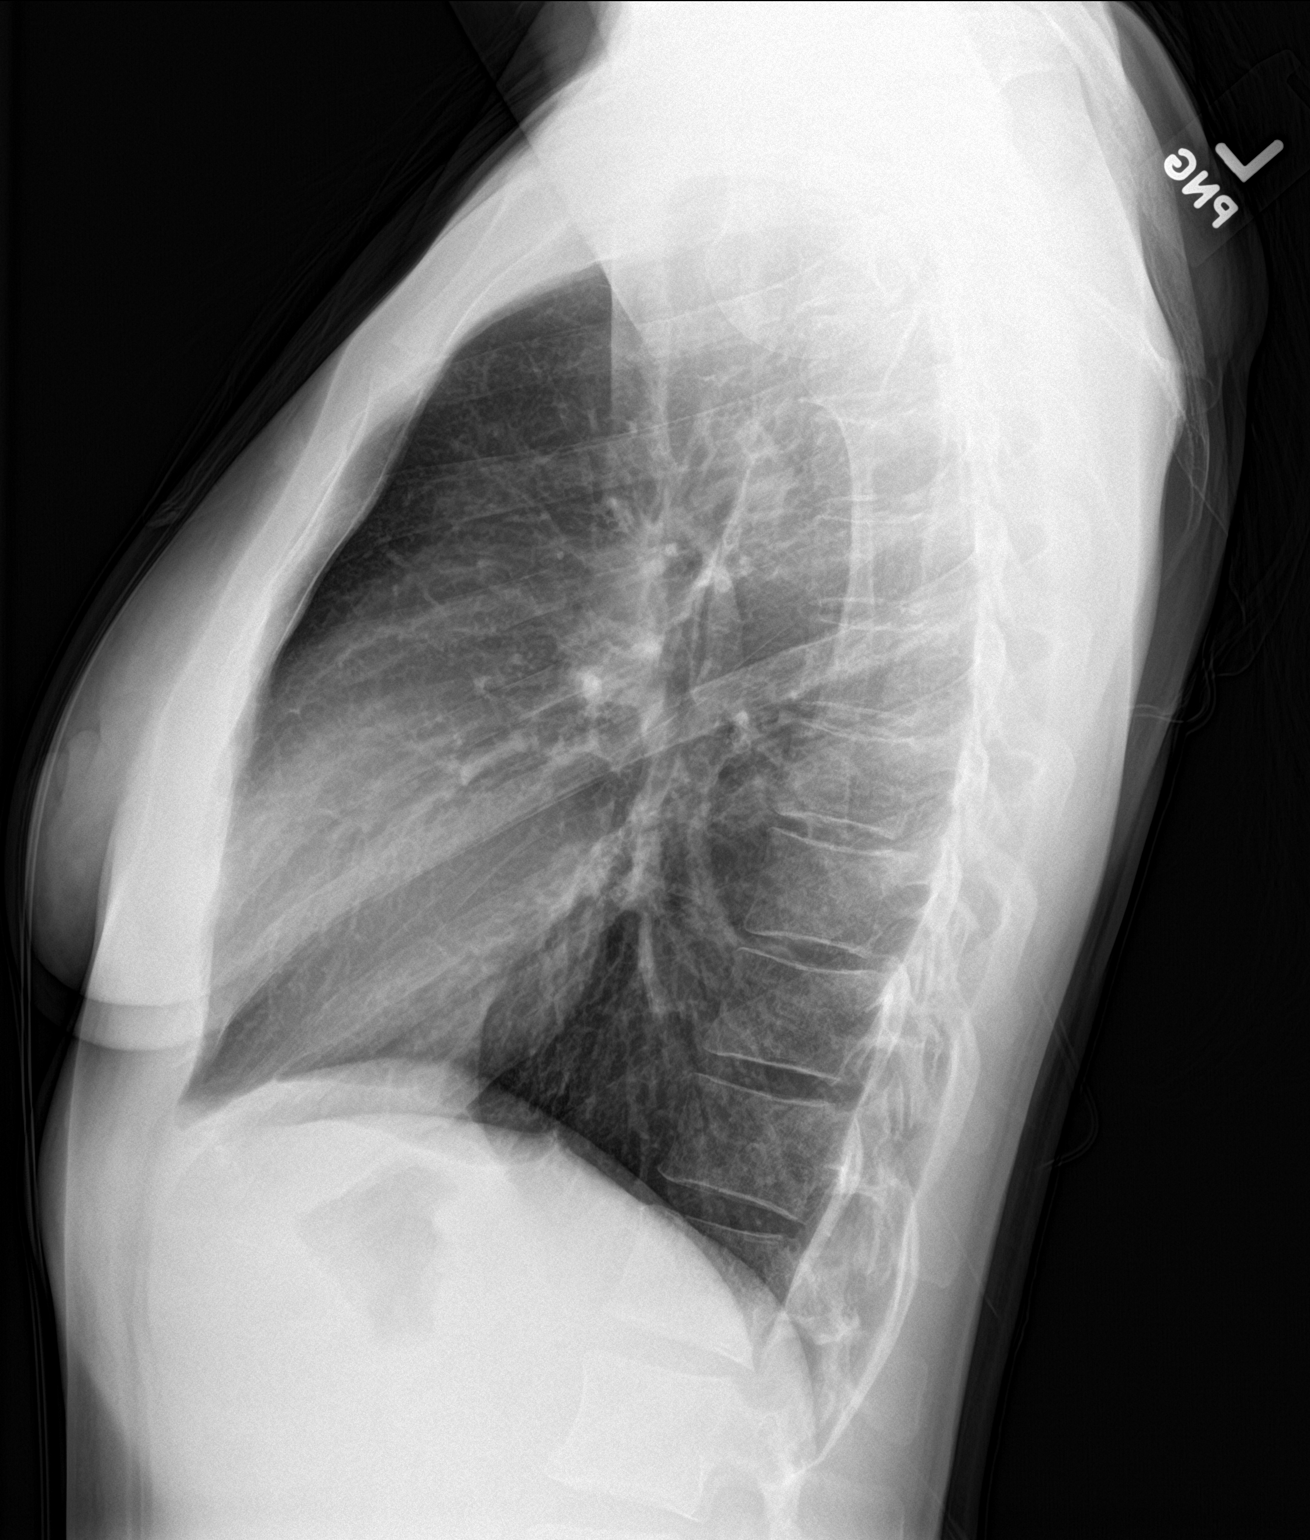

[2 of 2 positions shown; findings below may reference images not displayed]

FINDINGS: Normal heart size, mediastinal contours and pulmonary vascularity.

Slight accentuation of interstitial markings similar to previous
exam.

No acute infiltrate, pleural effusion or pneumothorax.

Nodular foci seen on prior CT exam are not identified
radiographically but this radiographic exam does not represent an
adequate followup of the previously visualized pulmonary nodules.

No acute osseous findings.
IMPRESSION: Improved aeration LEFT lung base without acute infiltrate.

This exam does not represent an adequate followup of the pulmonary
nodules previously identified by CT ; followup CT chest assessment
recommended in 3 months to ensure resolution of the previously
visualized nodules and exclude tumor.
